# Patient Record
Sex: Female | Born: 1970 | Race: Asian | Hispanic: No | Marital: Married | Smoking: Never smoker
Health system: Southern US, Community
[De-identification: ages and names within clinical notes are randomized; demographics above are authoritative.]

## PROBLEM LIST (undated history)

## (undated) HISTORY — PX: LIPOMA EXCISION: SHX5283

---

## 1998-12-27 ENCOUNTER — Other Ambulatory Visit: Admission: RE | Admit: 1998-12-27 | Discharge: 1998-12-27 | Payer: Self-pay | Admitting: Family Medicine

## 2000-06-04 ENCOUNTER — Other Ambulatory Visit: Admission: RE | Admit: 2000-06-04 | Discharge: 2000-06-04 | Payer: Self-pay | Admitting: Family Medicine

## 2000-06-25 ENCOUNTER — Ambulatory Visit (HOSPITAL_BASED_OUTPATIENT_CLINIC_OR_DEPARTMENT_OTHER): Admission: RE | Admit: 2000-06-25 | Discharge: 2000-06-25 | Payer: Self-pay

## 2002-12-01 ENCOUNTER — Emergency Department (HOSPITAL_COMMUNITY): Admission: EM | Admit: 2002-12-01 | Discharge: 2002-12-02 | Payer: Self-pay | Admitting: Emergency Medicine

## 2002-12-02 ENCOUNTER — Encounter: Payer: Self-pay | Admitting: Emergency Medicine

## 2004-01-22 ENCOUNTER — Emergency Department (HOSPITAL_COMMUNITY): Admission: EM | Admit: 2004-01-22 | Discharge: 2004-01-22 | Payer: Self-pay | Admitting: *Deleted

## 2004-11-14 IMAGING — US US ABDOMEN COMPLETE
1 series · 14 of 25 positions shown · non-contrast
Comparison: none

CLINICAL DATA: Abdominal pain. 
 COMPLETE ABDOMINAL ULTRASOUND, 01/22/04
 The gallbladder is well visualized and appears normal.  There are several mucosal folds within the gallbladder that simulate polyps or stones.  The common bile duct is normal at 3.3 mm in diameter.  Liver parenchyma, inferior vena cava, pancreas, spleen, and kidneys appear normal.  Proximal abdominal aorta is visible and appears normal.  Right kidney is 10.5 cm in length.  Left kidney is 10.7 cm in length. 
 IMPRESSION
 Normal abdominal ultrasound.

[Series 1: abdomen · 0.33mm/px · 14 of 81 slices shown]
[im 1/81]
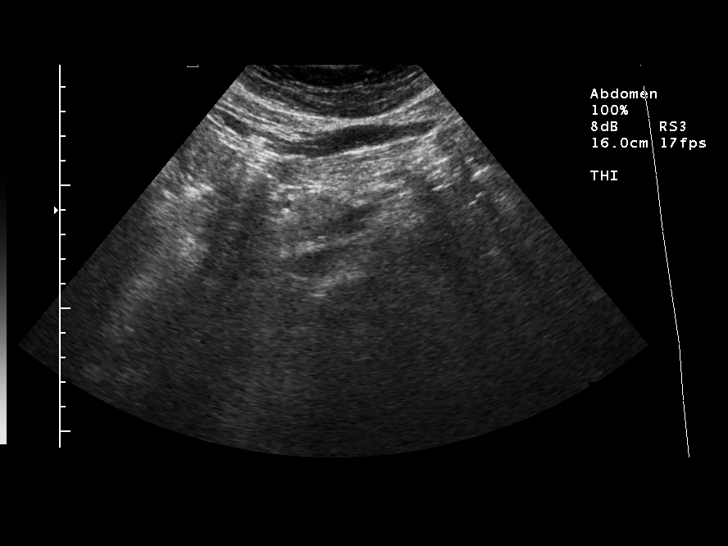
[im 7/81]
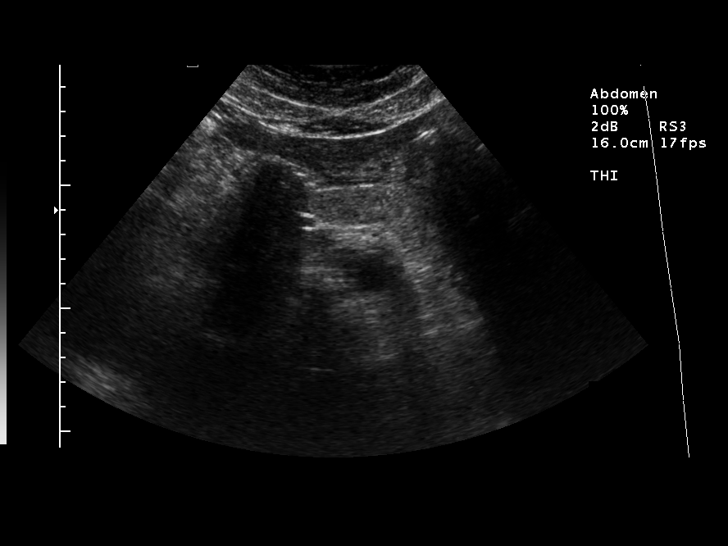
[im 14/81]
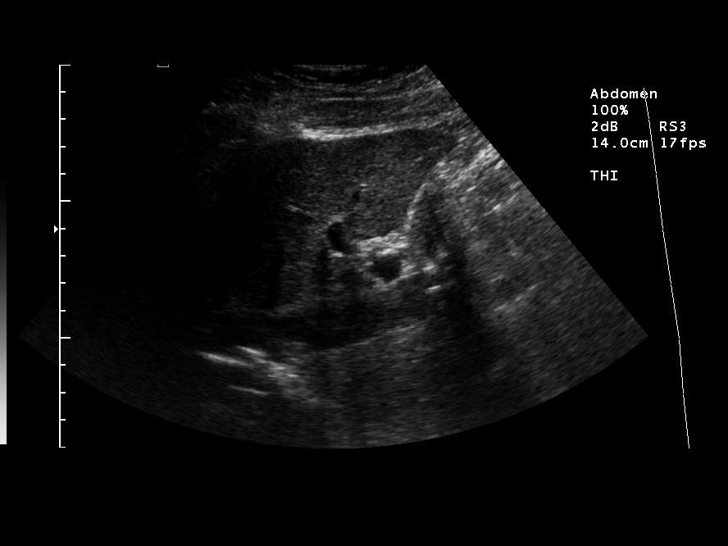
[im 21/81]
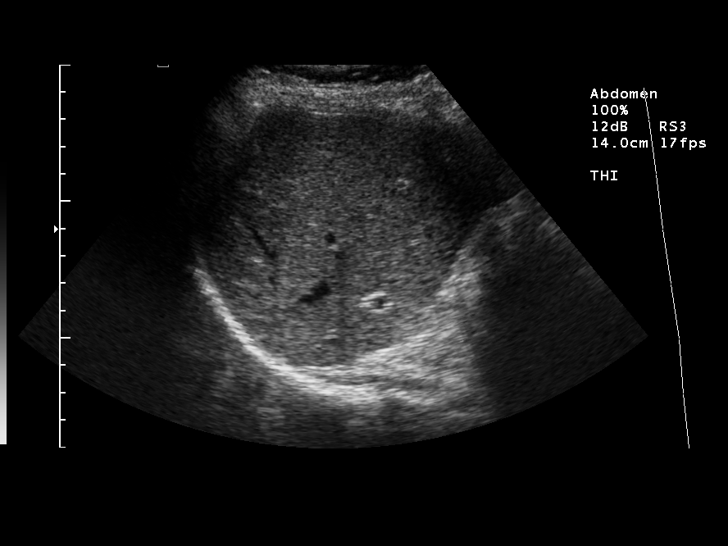
[im 27/81]
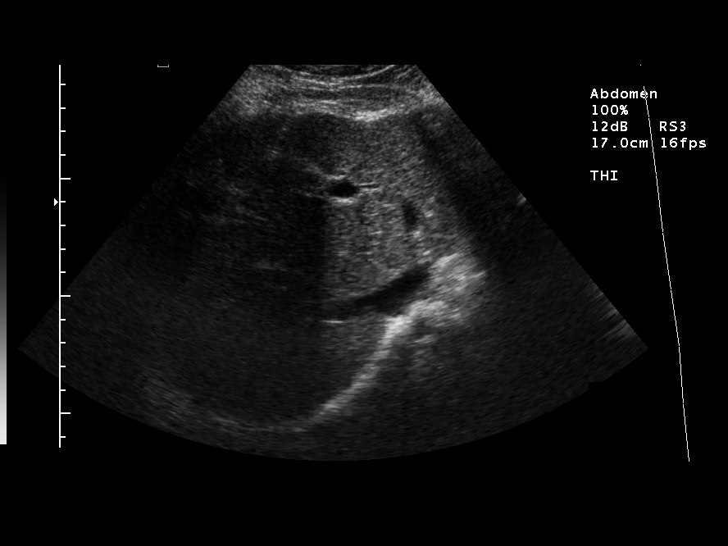
[im 31/81]
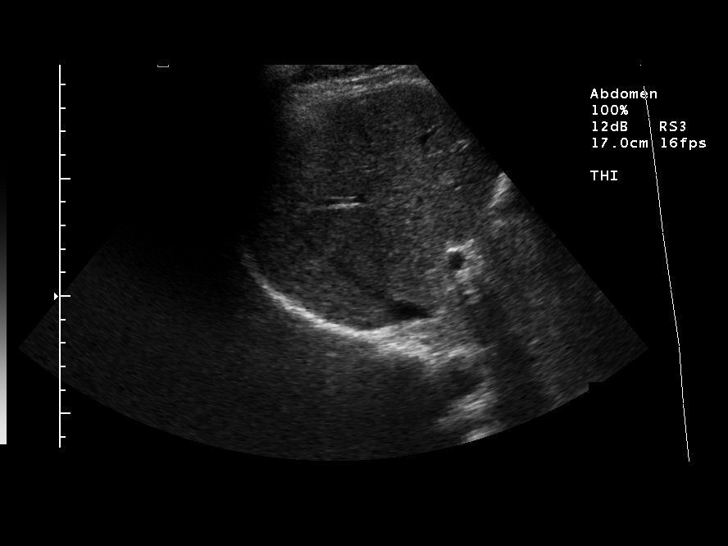
[im 37/81]
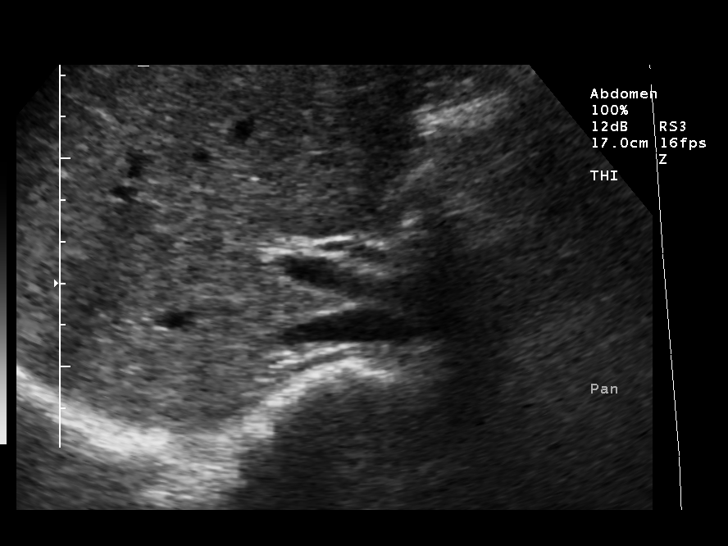
[im 44/81]
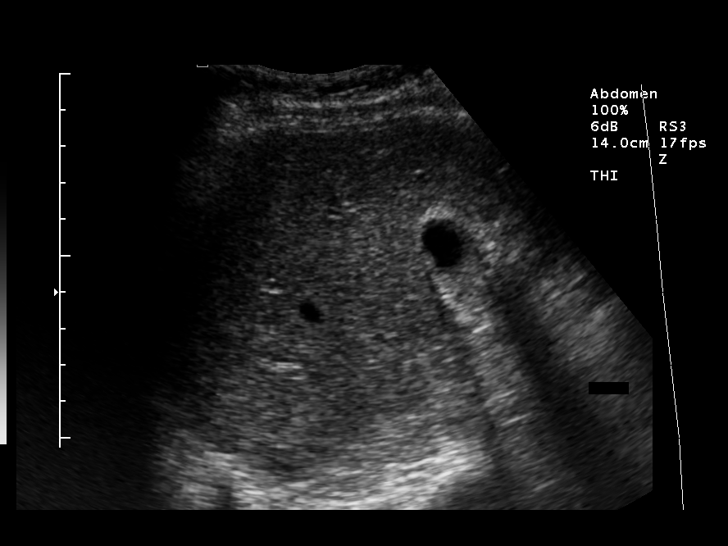
[im 51/81]
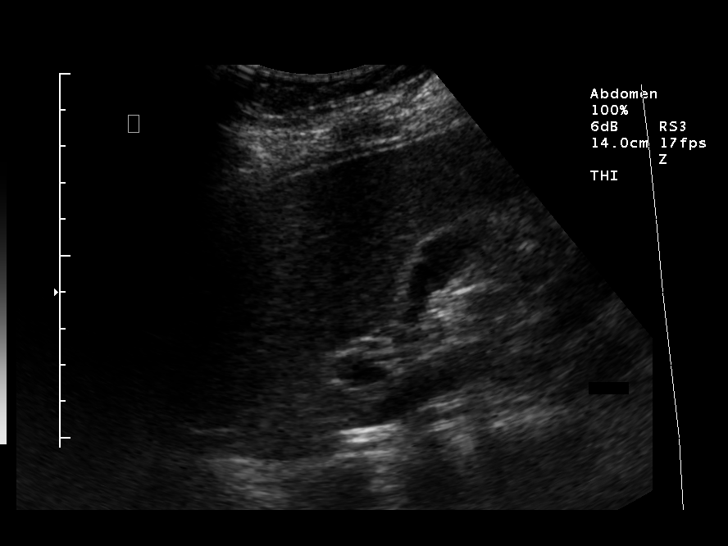
[im 54/81]
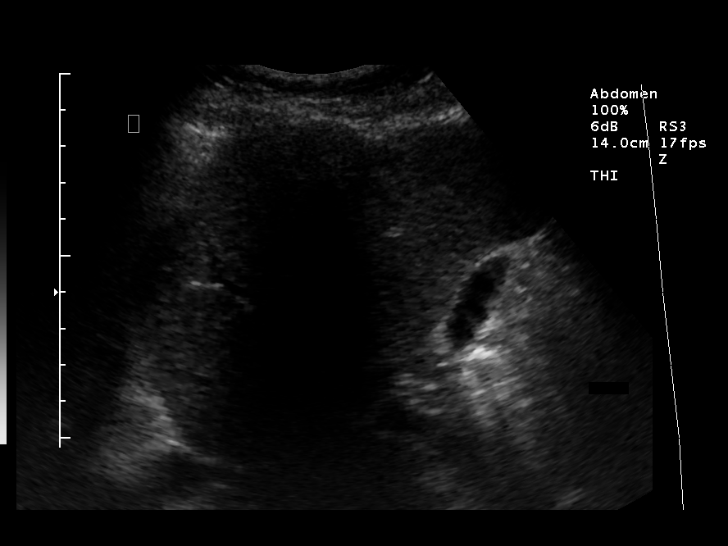
[im 61/81]
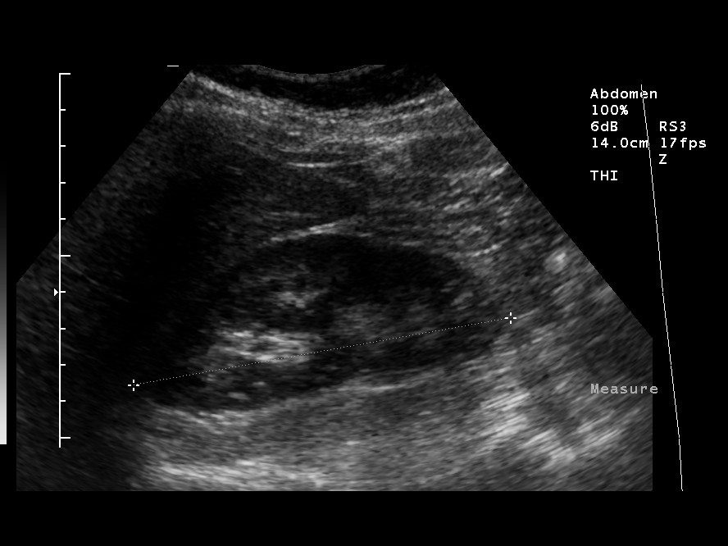
[im 67/81]
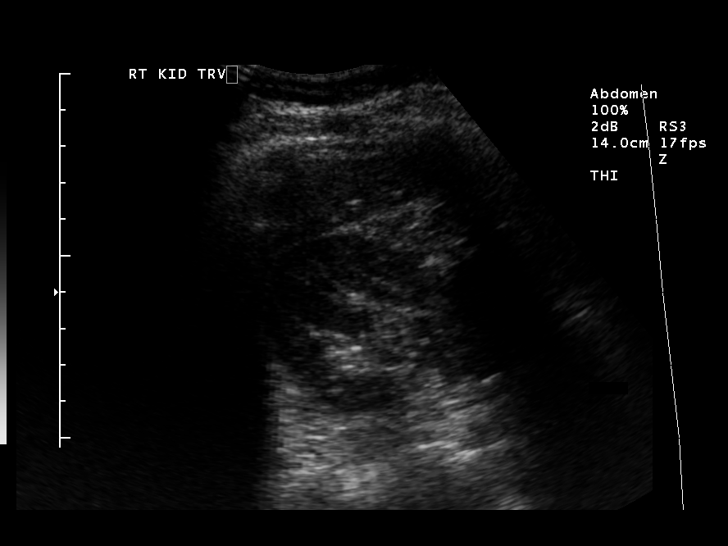
[im 74/81]
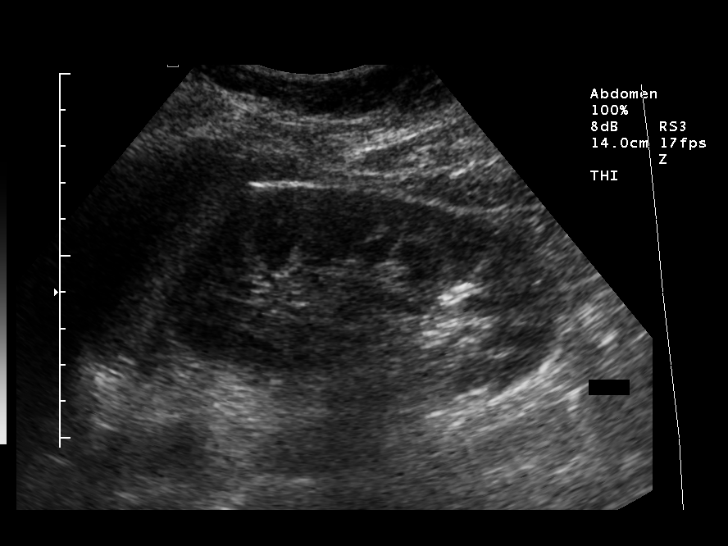
[im 81/81]
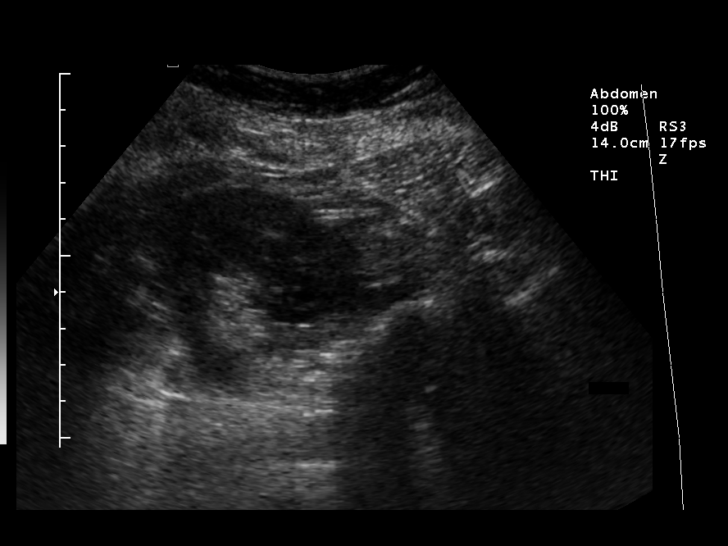

[14 of 25 positions shown; findings below may reference images not displayed]

## 2012-01-04 ENCOUNTER — Other Ambulatory Visit: Payer: Self-pay | Admitting: Family Medicine

## 2012-01-04 DIAGNOSIS — R234 Changes in skin texture: Secondary | ICD-10-CM

## 2012-01-04 DIAGNOSIS — N6459 Other signs and symptoms in breast: Secondary | ICD-10-CM

## 2012-01-25 ENCOUNTER — Ambulatory Visit (INDEPENDENT_AMBULATORY_CARE_PROVIDER_SITE_OTHER): Payer: Self-pay | Admitting: General Surgery

## 2012-01-25 ENCOUNTER — Other Ambulatory Visit (INDEPENDENT_AMBULATORY_CARE_PROVIDER_SITE_OTHER): Payer: Self-pay | Admitting: General Surgery

## 2012-01-25 ENCOUNTER — Encounter (INDEPENDENT_AMBULATORY_CARE_PROVIDER_SITE_OTHER): Payer: Self-pay | Admitting: General Surgery

## 2012-01-25 VITALS — BP 122/80 | HR 88 | Resp 20 | Ht 61.0 in | Wt 131.0 lb

## 2012-01-25 DIAGNOSIS — N6459 Other signs and symptoms in breast: Secondary | ICD-10-CM

## 2012-01-25 DIAGNOSIS — N6489 Other specified disorders of breast: Secondary | ICD-10-CM

## 2012-01-25 DIAGNOSIS — N6452 Nipple discharge: Secondary | ICD-10-CM

## 2012-01-25 NOTE — Progress Notes (Signed)
Patient ID: Patricia Wong, female   DOB: Aug 02, 1971, 41 y.o.   MRN: [id number]  Chief Complaint  Patient presents with  . Breast Problem    right nipple itching and clear discharge    HPI MIIA BLANKS is a 41 y.o. female.  Referred by Dr. Jeannetta Nap HPI 89 yof who is otherwise healthy who presents with over one month of right clear nipple discharge that she actually points to 2 o'clock on her areola when asked to point where drainage occurs.  This has been spontaneous.  It is no longer occuring for past couple of weeks.  She has itching on her areola from about 2 o'clock ccw to 9 o'clock.  She reports no other breast complaints and no prior breast history.  She has undergone mmg and [place] of right subareolar area that are both negative.  She comes in today for evaluation. She reports no crusting or any real skin changes. Some of this history was obtained through her son acting as interpreter.  History reviewed. No pertinent past medical history.  Past Surgical History  Procedure Date  . Lipoma excision     had benign mass on right side removed that sounds like lipoma    Family History  Problem Relation Age of Onset  . Diabetes Brother   . Thyroid cancer Maternal Grandfather     Social History History  Substance Use Topics  . Smoking status: Never Smoker   . Smokeless tobacco: Not on file  . Alcohol Use: No    No Known Allergies  No current outpatient prescriptions on file.    Review of Systems Review of Systems  Constitutional: Negative for fever, chills and unexpected weight change.  HENT: Negative for hearing loss, congestion, sore throat, trouble swallowing and voice change.   Eyes: Negative for visual disturbance.  Respiratory: Negative for cough and wheezing.   Cardiovascular: Negative for chest pain, palpitations and leg swelling.  Gastrointestinal: Negative for nausea, vomiting, abdominal pain, diarrhea, constipation, blood in stool, abdominal distention and anal bleeding.    Genitourinary: Negative for hematuria, vaginal bleeding and difficulty urinating.  Musculoskeletal: Negative for arthralgias.  Skin: Negative for rash and wound.  Neurological: Negative for seizures, syncope and headaches.  Hematological: Negative for adenopathy. Does not bruise/bleed easily.  Psychiatric/Behavioral: Negative for confusion.    Blood pressure 122/80, pulse 88, resp. rate 20, height 5\' 1"  (1.549 m), weight 131 lb (59.421 kg).  Physical Exam Physical Exam  Vitals reviewed. Constitutional: She appears well-developed and well-nourished.  Cardiovascular: Normal rate, regular rhythm and normal heart sounds.   Pulmonary/Chest: Effort normal and breath sounds normal. She has no wheezes. She has no rales. Right breast exhibits skin change (there is some thickening from about 2 o'clock around to nine in areola). Right breast exhibits no inverted nipple, no mass, no nipple discharge (I could not identify today) and no tenderness. Left breast exhibits no inverted nipple, no mass, no nipple discharge, no skin change and no tenderness. Breasts are symmetrical.  Lymphadenopathy:    She has no cervical adenopathy.    She has no axillary adenopathy.       Right: No supraclavicular adenopathy present.       Left: No supraclavicular adenopathy present.    Data Reviewed MMG reviewed  Assessment    Right nipple discharge and itching    Plan    Her exam is pretty normal today.  She has nl mmg/us.  The area is still itching and she specifically points to  one area. We discussed just following this area vs a punch biopsy.  She very much wanted to do the biopsy.  I cleansed the area, anesthetized with lidocaine and did a 4 mm punch biopsy of the most symptomatic area.  This was closed with 4-0 nylon suture.  Dressing was placed.  I will have her come back next week to remove stitch and go over results. We will see what results are.  I think may be reasonable to follow as she is not having  any discharge and has no clinical or radiologic abnormalities.  If she continues to have any discharge we may need to discuss a ductal excision.        Cadi Rhinehart 01/25/2012, 8:19 AM

## 2012-01-27 ENCOUNTER — Encounter (INDEPENDENT_AMBULATORY_CARE_PROVIDER_SITE_OTHER): Payer: Self-pay

## 2012-01-28 ENCOUNTER — Telehealth (INDEPENDENT_AMBULATORY_CARE_PROVIDER_SITE_OTHER): Payer: Self-pay | Admitting: General Surgery

## 2012-01-28 NOTE — Telephone Encounter (Signed)
LMOM for pt to return my call.  This is in regards to her bx results being okay.

## 2012-01-28 NOTE — Telephone Encounter (Signed)
Message copied by Littie Deeds on Thu Jan 28, 2012  8:01 AM ------      Message from: Dwain Sarna, MATTHEW      Created: Wed Jan 27, 2012  6:39 PM       Lesly Rubenstein,      Would you call her and tell her the biopsy was fine.      MW      ----- Message -----         From: [facility]         Sent: 01/27/2012   5:53 PM           To: Emelia Loron, MD

## 2012-01-29 ENCOUNTER — Telehealth (INDEPENDENT_AMBULATORY_CARE_PROVIDER_SITE_OTHER): Payer: Self-pay | Admitting: General Surgery

## 2012-01-29 NOTE — Telephone Encounter (Signed)
Patient made aware of path results. Will follow up at appt and call with any questions prior.  

## 2012-01-29 NOTE — Telephone Encounter (Signed)
Message copied by Liliana Cline on Fri Jan 29, 2012  8:35 AM ------      Message from: Dwain Sarna, MATTHEW      Created: Wed Jan 27, 2012  6:39 PM       Lesly Rubenstein,      Would you call her and tell her the biopsy was fine.      MW      ----- Message -----         From: [facility]         Sent: 01/27/2012   5:53 PM           To: Emelia Loron, MD

## 2012-02-02 ENCOUNTER — Encounter (INDEPENDENT_AMBULATORY_CARE_PROVIDER_SITE_OTHER): Payer: Self-pay | Admitting: General Surgery

## 2012-02-02 ENCOUNTER — Ambulatory Visit (INDEPENDENT_AMBULATORY_CARE_PROVIDER_SITE_OTHER): Payer: Self-pay | Admitting: General Surgery

## 2012-02-02 VITALS — BP 108/60 | HR 71 | Temp 97.8°F | Resp 14 | Ht 61.0 in | Wt 130.8 lb

## 2012-02-02 DIAGNOSIS — N6489 Other specified disorders of breast: Secondary | ICD-10-CM | POA: Insufficient documentation

## 2012-02-02 NOTE — Progress Notes (Signed)
Subjective:     Patient ID: Patricia Wong, female   DOB: [birth year], 41 y.o.   MRN: [id number]  HPI This is a 41 yof who I saw last week with nl mmg/us. She had an episode of discharge and some changes in her nipple. These were not really apparent last week but we did a punch biopsy in the symptomatic area that is benign. Today she returns without any more discharge or any complaints.  She has a stitch that I removed as well.  Review of Systems     Objective:   Physical Exam Right breast without mass, discharge, biopsy site healing well    Assessment:     Right breast discharge, resolved    Plan:     I don't think this is related to breast cancer/Pagets disease.  I think is reasonable to hold off further evaluation and see her back in a month. If she has any further symptoms then we will consider mri/surgery.

## 2012-03-14 ENCOUNTER — Encounter (INDEPENDENT_AMBULATORY_CARE_PROVIDER_SITE_OTHER): Payer: Self-pay | Admitting: General Surgery

## 2012-03-15 ENCOUNTER — Encounter (INDEPENDENT_AMBULATORY_CARE_PROVIDER_SITE_OTHER): Payer: Self-pay | Admitting: General Surgery

## 2019-10-29 ENCOUNTER — Ambulatory Visit: Payer: Self-pay | Attending: Internal Medicine

## 2019-10-29 DIAGNOSIS — Z23 Encounter for immunization: Secondary | ICD-10-CM | POA: Insufficient documentation

## 2019-10-29 NOTE — Progress Notes (Signed)
   Covid-19 Vaccination Clinic  Name:  Patricia Wong    MRN: [id number] DOB: 12-10-1970  10/29/2019  [name] was observed post Covid-19 immunization for 15 minutes without incident. She was provided with Vaccine Information Sheet and instruction to access the V-Safe system.   [name] was instructed to call 911 with any severe reactions post vaccine: Marland Kitchen Difficulty breathing  . Swelling of face and throat  . A fast heartbeat  . A bad rash all over body  . Dizziness and weakness   Immunizations Administered    Name Date Dose VIS Date Route   Pfizer COVID-19 Vaccine 10/29/2019  5:20 PM 0.3 mL 08/04/2019 Intramuscular   Manufacturer: [employer], [employer]   Lot: [id number]   NDC: [address]-7

## 2019-11-28 ENCOUNTER — Ambulatory Visit: Payer: Self-pay | Attending: Internal Medicine

## 2019-11-28 DIAGNOSIS — Z23 Encounter for immunization: Secondary | ICD-10-CM

## 2019-11-28 NOTE — Progress Notes (Signed)
   Covid-19 Vaccination Clinic  Name:  Patricia Wong    MRN: [id number] DOB: Nov 02, 1970  11/28/2019  Patricia Wong was observed post Covid-19 immunization for 15 minutes without incident. She was provided with Vaccine Information Sheet and instruction to access the V-Safe system.   Patricia Wong was instructed to call 911 with any severe reactions post vaccine: Marland Kitchen Difficulty breathing  . Swelling of face and throat  . A fast heartbeat  . A bad rash all over body  . Dizziness and weakness   Immunizations Administered    Name Date Dose VIS Date Route   Pfizer COVID-19 Vaccine 11/28/2019  1:10 PM 0.3 mL 08/04/2019 Intramuscular   Manufacturer: [employer], [employer]   Lot: [id number]   NDC: [address]-6
